# Patient Record
Sex: Female | Born: 1960 | Race: White | Hispanic: No | Marital: Married | Smoking: Current every day smoker
Health system: Southern US, Community
[De-identification: ages and names within clinical notes are randomized; demographics above are authoritative.]

---

## 2006-05-08 ENCOUNTER — Emergency Department (HOSPITAL_COMMUNITY): Admission: EM | Admit: 2006-05-08 | Discharge: 2006-05-08 | Payer: Self-pay | Admitting: Emergency Medicine

## 2017-08-05 ENCOUNTER — Emergency Department (HOSPITAL_COMMUNITY)
Admission: EM | Admit: 2017-08-05 | Discharge: 2017-08-05 | Disposition: A | Discharge status: Home / Self Care | Payer: Self-pay | Attending: Emergency Medicine | Admitting: Emergency Medicine

## 2017-08-05 ENCOUNTER — Encounter (HOSPITAL_COMMUNITY): Payer: Self-pay

## 2017-08-05 ENCOUNTER — Other Ambulatory Visit: Payer: Self-pay

## 2017-08-05 ENCOUNTER — Emergency Department (HOSPITAL_COMMUNITY): Payer: Self-pay

## 2017-08-05 DIAGNOSIS — R0789 Other chest pain: Secondary | ICD-10-CM | POA: Insufficient documentation

## 2017-08-05 DIAGNOSIS — F17210 Nicotine dependence, cigarettes, uncomplicated: Secondary | ICD-10-CM | POA: Insufficient documentation

## 2017-08-05 MED ORDER — TRAMADOL HCL 50 MG PO TABS: 50.0000 mg | ORAL_TABLET | Freq: Two times a day (BID) | ORAL | 0 refills | Status: DC | PRN

## 2017-08-05 MED ORDER — KETOROLAC TROMETHAMINE 60 MG/2ML IM SOLN
30.0000 mg | Freq: Once | INTRAMUSCULAR | Status: AC
  Administered 2017-08-05: 30 mg via INTRAMUSCULAR
  Filled 2017-08-05: qty 2

## 2017-08-05 MED ORDER — OXYCODONE-ACETAMINOPHEN 5-325 MG PO TABS
1.0000 | ORAL_TABLET | Freq: Once | ORAL | Status: AC
  Administered 2017-08-05: 1 via ORAL
  Filled 2017-08-05: qty 1

## 2017-08-05 NOTE — ED Notes (Addendum)
Pt reports that she had tripped over a stepping stone and fell on a rock and now is c/o left sided rib pain, no redness or bruising or deformities are noted. Pt spouse is present.

## 2017-08-05 NOTE — ED Triage Notes (Addendum)
Patient tripped on a rock stepping stone and landed on her left rib cage area. Patient c/o severe pain left lower rib cage . Patient denies LOC or hitting her head.

## 2017-08-05 NOTE — ED Notes (Signed)
Reviewed incentive spiriometer

## 2017-08-05 NOTE — ED Provider Notes (Signed)
[facility]-EMERGENCY DEPT Provider Note   CSN: [id number] Arrival date & time: 08/05/17  1412     History   Chief Complaint Chief Complaint  Patient presents with  . Fall  . rib cage pain    left    HPI [name] is a 56 y.o. female who presents today with chief complaint acute onset, constant left-sided chest pain secondary to fall earlier today.  Patient states that she tripped on a stepping stone at around 9:30 AM and fell forward landing on another stone on her left side.  She denies head injury or loss of consciousness.  Since then she endorses development of constant throbbing left-sided chest pain which does not radiate.  Pain worsens and becomes sharp with deep inspiration and certain movements.  She has some shortness of breath secondary to pain.  She did not try anything prior to arrival.  She denies numbness, tingling, weakness, abdominal pain, nausea, vomiting.  She is a smoker of approximately 5 cigarettes daily.  Denies any medical problems.  The history is provided by the patient.    History reviewed. No pertinent past medical history.  There are no active problems to display for this patient.   History reviewed. No pertinent surgical history.  OB History    No data available       Home Medications    Prior to Admission medications   Medication Sig Start Date End Date Taking? Authorizing Provider  traMADol (ULTRAM) 50 MG tablet Take 1 tablet (50 mg total) by mouth every 12 (twelve) hours as needed for severe pain. 08/05/17   Jeanie SewerFawze, Synda Bagent A, PA-C    Family History Family History  Problem Relation Age of Onset  . Heart failure Mother   . Heart failure Father   . Diabetes Father   . Glaucoma Father     Social History Social History   Tobacco Use  . Smoking status: Current Every Day Smoker    Packs/day: 0.15    Types: Cigarettes  . Smokeless tobacco: Never Used  Substance Use Topics  . Alcohol use: No    Frequency:  Never  . Drug use: No     Allergies   Patient has no known allergies.   Review of Systems Review of Systems  Constitutional: Negative for fever.  Respiratory: Positive for shortness of breath. Negative for cough.   Cardiovascular: Positive for chest pain.  Gastrointestinal: Negative for abdominal pain, nausea and vomiting.  Genitourinary: Negative for flank pain.  Musculoskeletal: Negative for arthralgias.  Neurological: Negative for syncope, weakness and numbness.  All other systems reviewed and are negative.    Physical Exam Updated Vital Signs BP 131/69 (BP Location: Left Arm)   Pulse 86   Temp 97.7 F (36.5 C) (Oral)   Resp 18   Ht 5\' 6"  (1.676 m)   Wt 70.3 kg (155 lb)   SpO2 100%   BMI 25.02 kg/m   Physical Exam  Constitutional: She appears well-developed and well-nourished. No distress.  HENT:  Head: Normocephalic and atraumatic.  Eyes: Conjunctivae and EOM are normal. Pupils are equal, round, and reactive to light. Right eye exhibits no discharge. Left eye exhibits no discharge.  Neck: Normal range of motion. Neck supple. No JVD present. No tracheal deviation present.  No midline spine TTP, no paraspinal muscle tenderness, no deformity, crepitus, or step-off noted   Cardiovascular: Normal rate, regular rhythm and normal heart sounds.  Pulmonary/Chest: Effort normal and breath sounds normal. She exhibits tenderness.  Equal rise and fall of chest, no increased work of breathing, patient unable to take a deep breath secondary to pain.  Focal tenderness to palpation of the left lower anterior chest wall along the anterior axillary line and costal margin.  No ecchymosis, crepitus, or paradoxical wall motion.  Abdominal: She exhibits no distension.  Musculoskeletal: Normal range of motion. She exhibits no edema.  No midline spine TTP, no paraspinal muscle tenderness, no deformity, crepitus, or step-off noted   Neurological: She is alert.  Fluent speech, no facial  droop.  Skin: Skin is warm and dry. No erythema.  Psychiatric: She has a normal mood and affect. Her behavior is normal.  Nursing note and vitals reviewed.    ED Treatments / Results  Labs (all labs ordered are listed, but only abnormal results are displayed) Labs Reviewed - No data to display  EKG  EKG Interpretation None       Radiology Dg Ribs Unilateral W/chest Left  Result Date: 08/05/2017 CLINICAL DATA:  Patient tripped on rock, landed on LEFT rib cage area. Pain. EXAM: LEFT RIBS AND CHEST - 3+ VIEW COMPARISON:  None. FINDINGS: No fracture or other bone lesions are seen involving the ribs. There is no evidence of pneumothorax or pleural effusion. Both lungs are clear, except for streaky densities at LEFT base, likely scarring. Heart size and mediastinal contours are within normal limits. IMPRESSION: Negative for rib fracture. Electronically Signed   By: Elsie StainJohn T Curnes M.D.   On: 08/05/2017 15:31    Procedures Procedures (including critical care time)  Medications Ordered in ED Medications  ketorolac (TORADOL) injection 30 mg (30 mg Intramuscular Given 08/05/17 1541)  oxyCODONE-acetaminophen (PERCOCET/ROXICET) 5-325 MG per tablet 1 tablet (1 tablet Oral Given 08/05/17 1541)     Initial Impression / Assessment and Plan / ED Course  I have reviewed the triage vital signs and the nursing notes.  Pertinent labs & imaging results that were available during my care of the patient were reviewed by me and considered in my medical decision making (see chart for details).     Patient presents with left-sided chest wall pain secondary to fall earlier today.  Pain reproducible on palpation.  Afebrile, vital signs are stable.  Low suspicion of ACS or MI in the presence of trauma.  No evidence of flail chest.  Radiographs of the chest reviewed by me show no evidence of fracture or acute cardiopulmonary abnormality.  No evidence of pneumothorax, hemothorax, or cardiac tamponade.  Pain  managed while in the ED.  Given significant discomfort with inspiration in a patient who is a smoker, will discharge with incentive spirometer to avoid the development of a pneumonia.  She will follow-up with her husband's primary care physician for reevaluation of her symptoms.  Discussed indications for return to the ED.  Will discharge with a small amount of tramadol for severe pain.  [place] substance registry shows she last received tramadol July 2017 and has not received any narcotic medication since then.  Patient and patient's husband verbalized understanding of and patient stable for discharge home at this time.  Final Clinical Impressions(s) / ED Diagnoses   Final diagnoses:  Chest wall pain    ED Discharge Orders        Ordered    traMADol (ULTRAM) 50 MG tablet  Every 12 hours PRN     08/05/17 1603       Jeanie SewerFawze, Lino Wickliff A, PA-C 08/05/17 1757    Nira Connardama, Pedro Eduardo, MD 08/06/17  2001  

## 2017-08-05 NOTE — Discharge Instructions (Signed)
Alternate 600 mg of ibuprofen and [phone] mg of Tylenol every 3 hours as needed for pain. Do not exceed 4000 mg of Tylenol daily.  Take tramadol for severe pain but do not drive, drink alcohol, or operate heavy machinery while on this medication.  Apply ice or heat for comfort to the affected area.  Use the incentive spirometer 2 or 3 times daily to avoid development of a pneumonia.  Follow-up with a primary care physician within 1 week for reevaluation of your symptoms.  Return to the ED immediately for any concerning signs or symptoms develop such as persisting shortness of breath, fevers, or productive cough.

## 2018-10-11 IMAGING — CR DG RIBS W/ CHEST 3+V*L*
4 series · 4 of 4 positions shown · non-contrast
Comparison: None.

CLINICAL DATA: Patient tripped on rock, landed on LEFT rib cage
area. Pain.

EXAM:
LEFT RIBS AND CHEST - 3+ VIEW

[w chest pa]
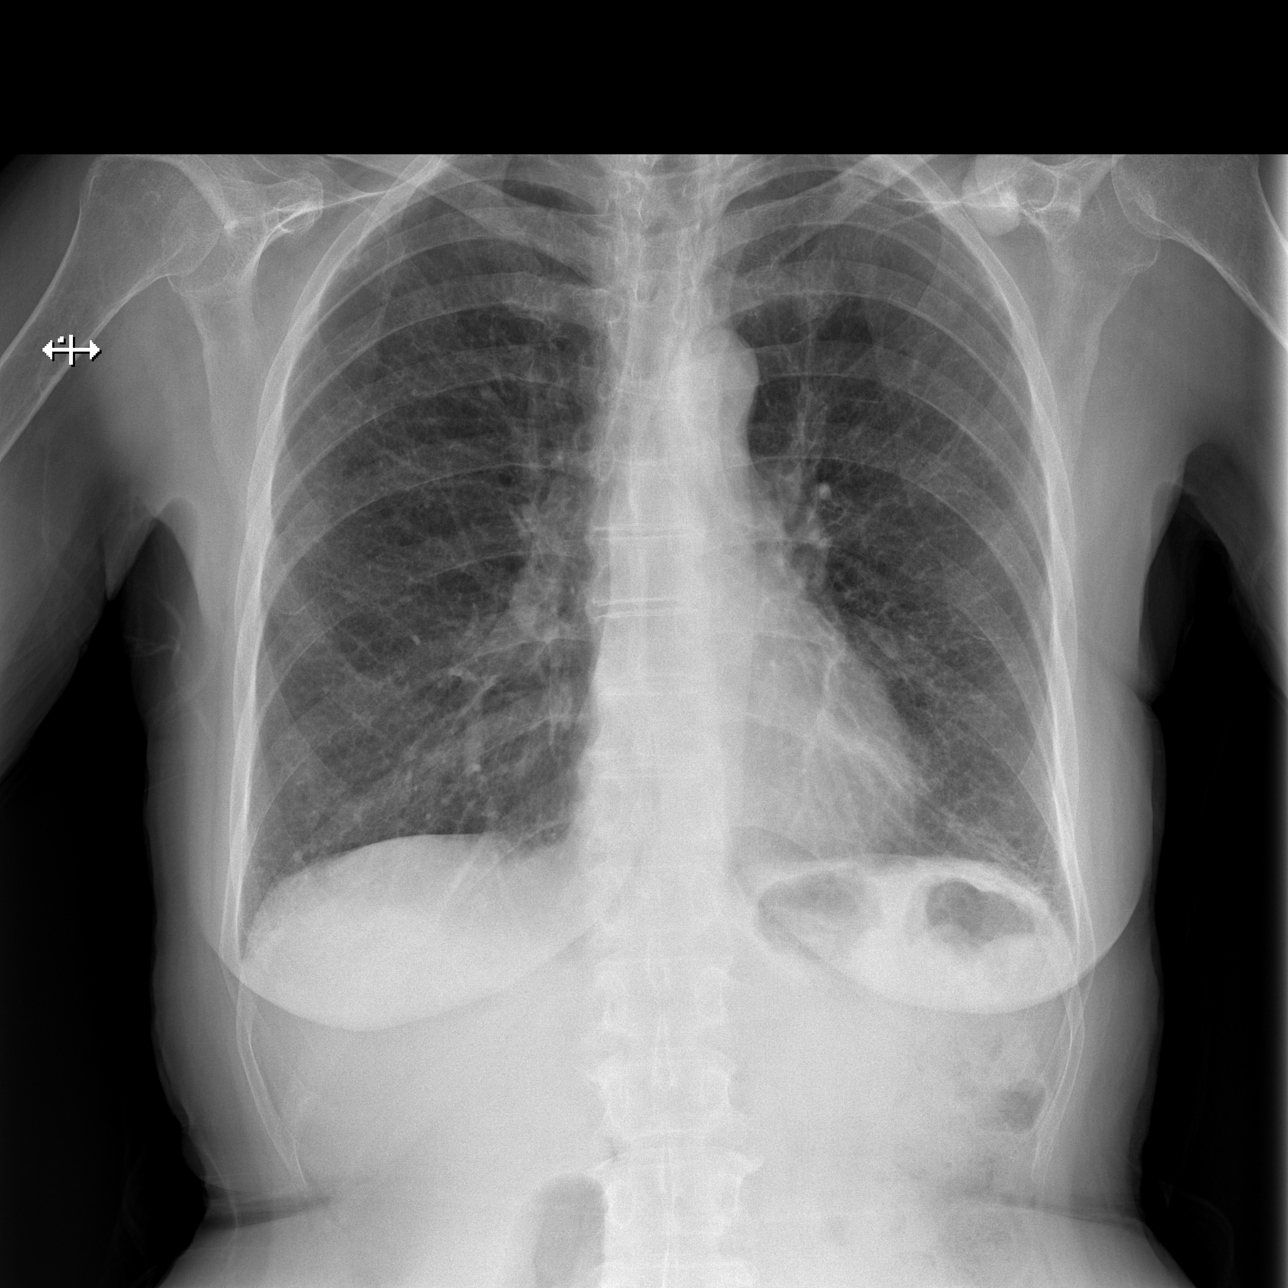

[w ribs ap upper left]
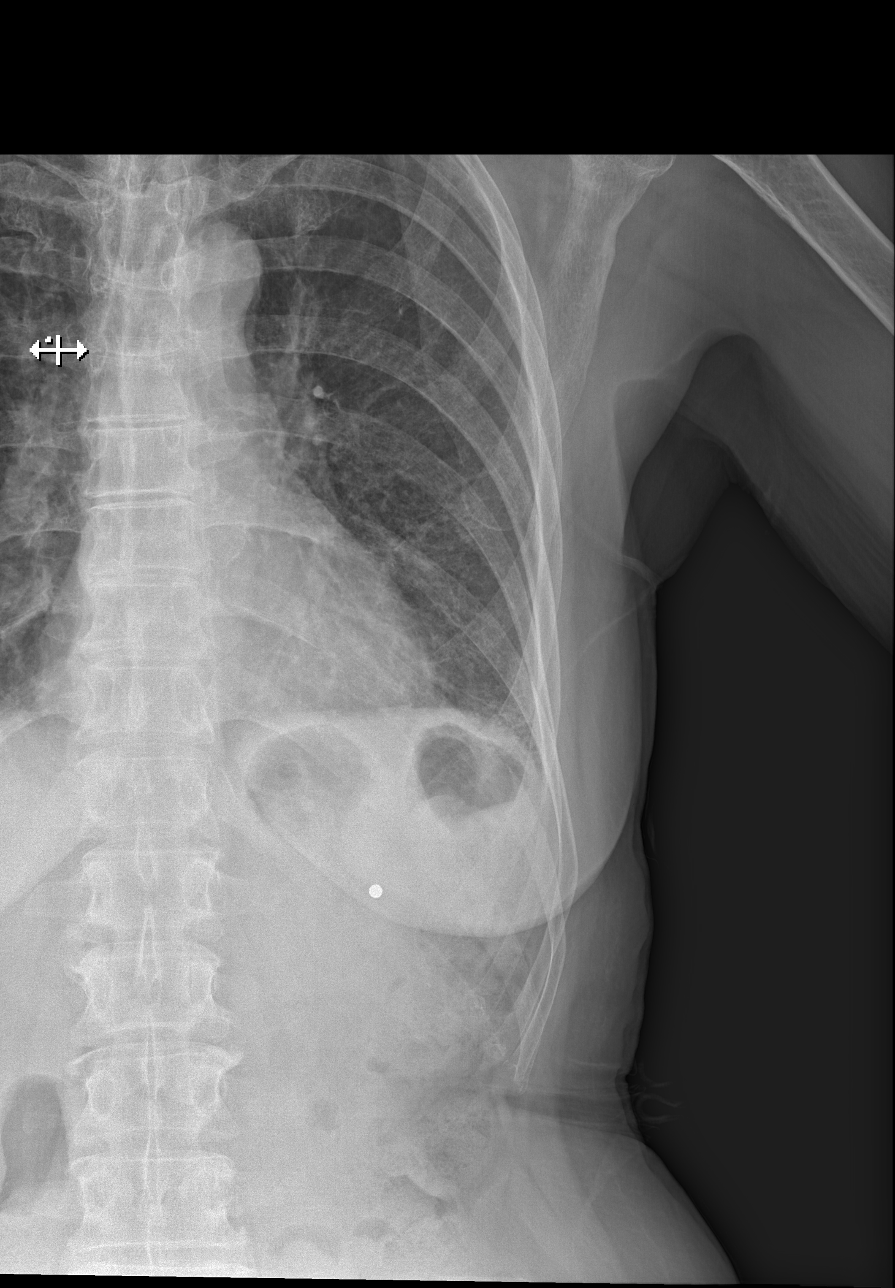

[w ribs obl left (1 of 2)]
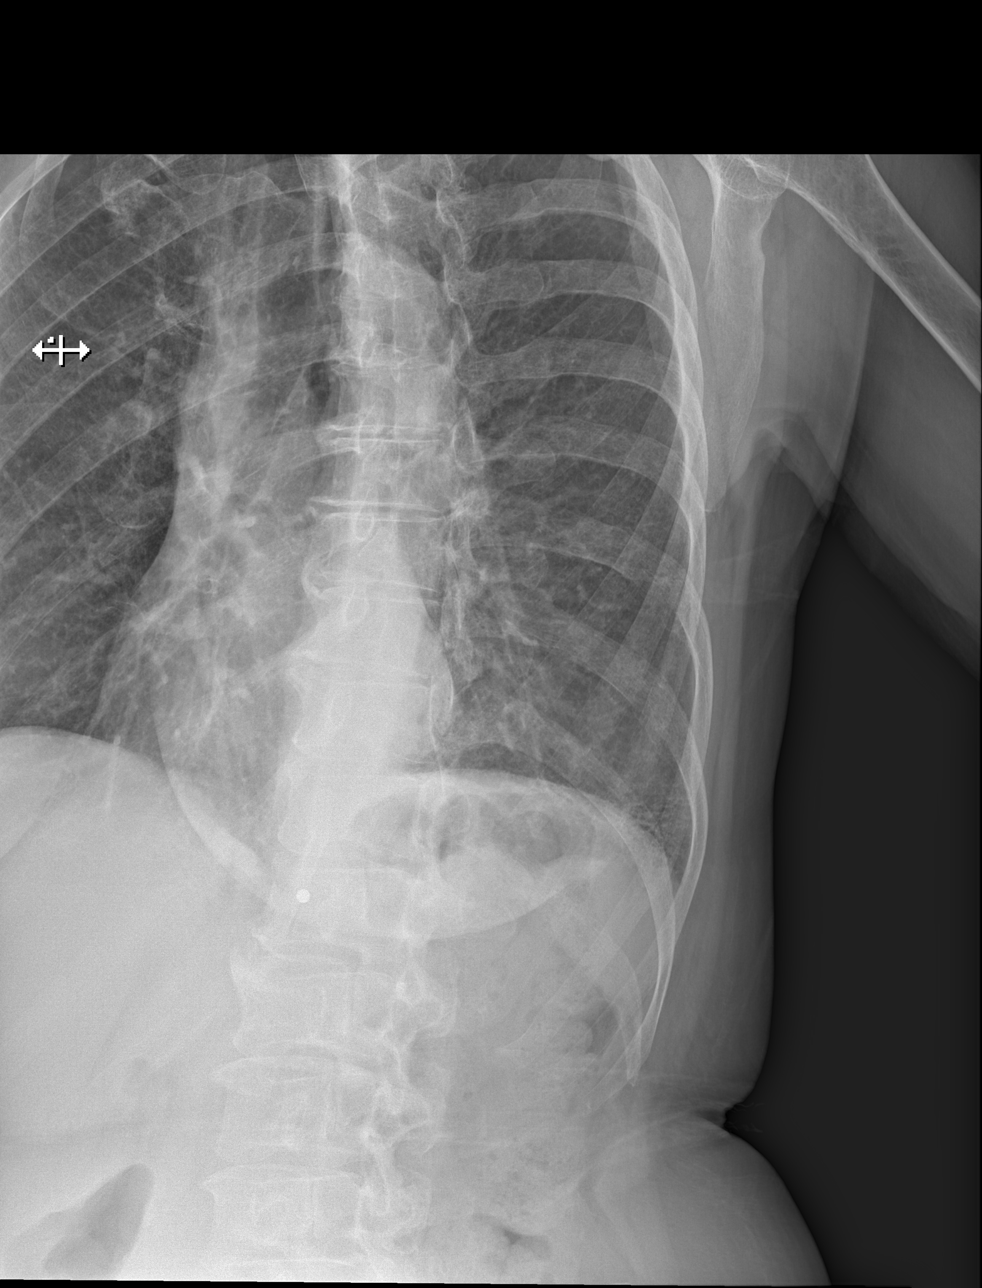

[w ribs obl left (2 of 2)]
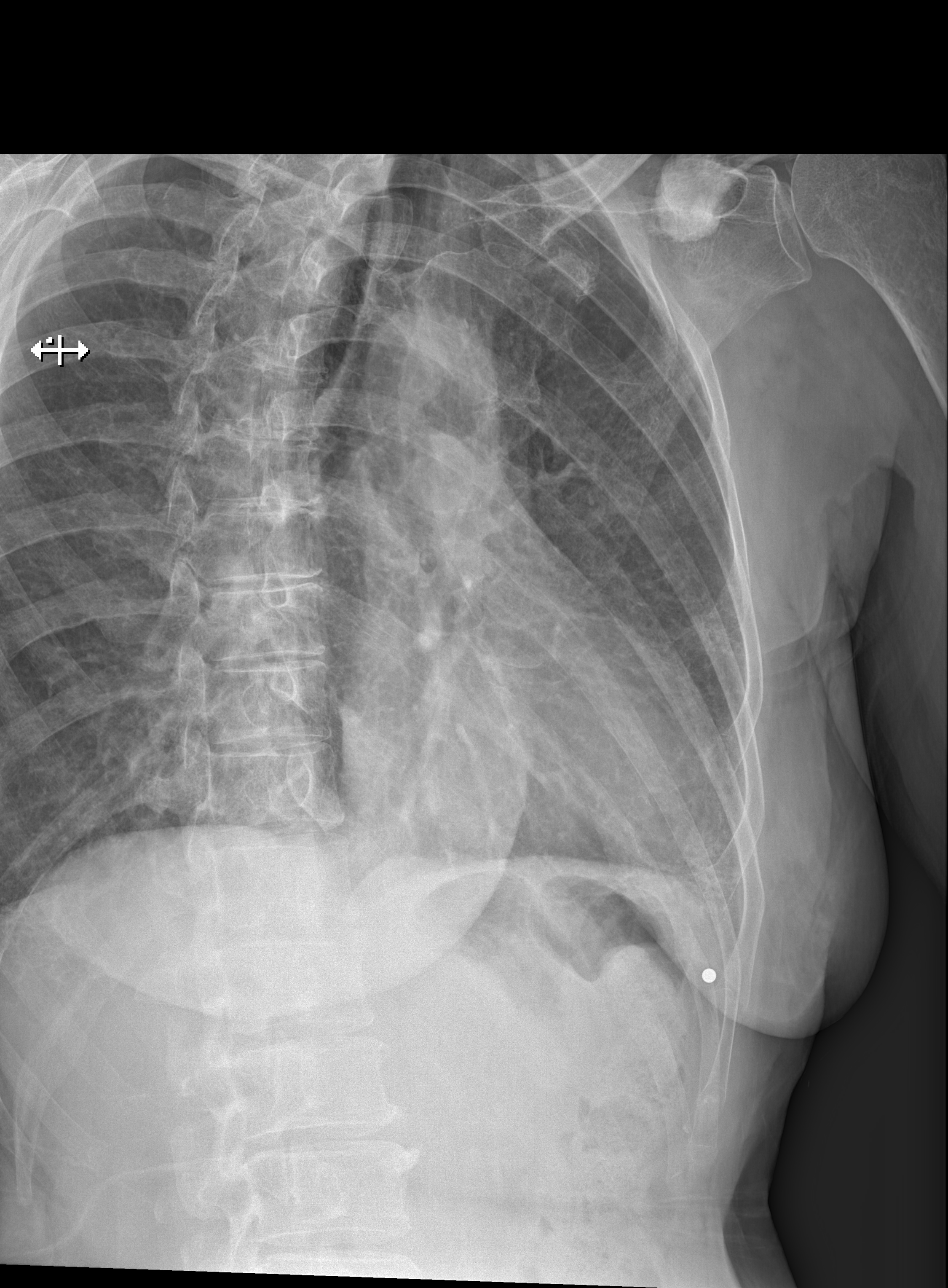

[4 of 4 positions shown; findings below may reference images not displayed]

FINDINGS: No fracture or other bone lesions are seen involving the ribs. There
is no evidence of pneumothorax or pleural effusion. Both lungs are
clear, except for streaky densities at LEFT base, likely scarring.
Heart size and mediastinal contours are within normal limits.
IMPRESSION: Negative for rib fracture.

## 2021-10-08 ENCOUNTER — Ambulatory Visit (HOSPITAL_COMMUNITY)
Admission: EM | Admit: 2021-10-08 | Discharge: 2021-10-08 | Disposition: A | Discharge status: Home / Self Care | Payer: Self-pay | Attending: Emergency Medicine | Admitting: Emergency Medicine

## 2021-10-08 ENCOUNTER — Encounter (HOSPITAL_COMMUNITY): Payer: Self-pay | Admitting: Emergency Medicine

## 2021-10-08 ENCOUNTER — Other Ambulatory Visit: Payer: Self-pay

## 2021-10-08 DIAGNOSIS — W540XXA Bitten by dog, initial encounter: Secondary | ICD-10-CM

## 2021-10-08 DIAGNOSIS — S61210A Laceration without foreign body of right index finger without damage to nail, initial encounter: Secondary | ICD-10-CM

## 2021-10-08 MED ORDER — AMOXICILLIN-POT CLAVULANATE 875-125 MG PO TABS: 1.0000 | ORAL_TABLET | Freq: Two times a day (BID) | ORAL | 0 refills | Status: AC

## 2021-10-08 NOTE — ED Provider Notes (Addendum)
Casey Rice    CSN: [id number] Arrival date & time: 10/08/21  1944    HISTORY   Chief Complaint  Patient presents with   Finger Injury   HPI Casey Rice is a 61 y.o. female. Patient presents to urgent care 45 minutes after being bitten by her dog.  Patient states she was attempting to take away a tray of food that the dog was licking clean and the dog bit her.  Patient states it was her dog, dog is up-to-date on her rabies vaccine, patient states her last tetanus vaccine was 2 to 3 years ago.  Patient states she did not do anything to treat the wound prior to arrival.   History reviewed. No pertinent past medical history. There are no problems to display for this patient.  History reviewed. No pertinent surgical history. OB History   No obstetric history on file.    Home Medications    Prior to Admission medications   Medication Sig Start Date End Date Taking? Authorizing Provider  traMADol (ULTRAM) 50 MG tablet Take 1 tablet (50 mg total) by mouth every 12 (twelve) hours as needed for severe pain. 08/05/17   Renita Papa, PA-C    Family History Family History  Problem Relation Age of Onset   Heart failure Mother    Heart failure Father    Diabetes Father    Glaucoma Father    Social History Social History   Tobacco Use   Smoking status: Every Day    Packs/day: 0.15    Types: Cigarettes   Smokeless tobacco: Never  Vaping Use   Vaping Use: Never used  Substance Use Topics   Alcohol use: No   Drug use: No   Allergies   Patient has no known allergies.  Review of Systems Review of Systems Pertinent findings noted in history of present illness.   Physical Exam Triage Vital Signs ED Triage Vitals  Enc Vitals Group     BP 07/03/21 0827 (!) 147/82     Pulse Rate 07/03/21 0827 72     Resp 07/03/21 0827 18     Temp 07/03/21 0827 98.3 F (36.8 C)     Temp Source 07/03/21 0827 Oral     SpO2 07/03/21 0827 98 %     Weight --      Height --       Head Circumference --      Peak Flow --      Pain Score 07/03/21 0826 5     Pain Loc --      Pain Edu? --      Excl. in [place]? --   No data found.  Updated Vital Signs BP (!) 171/73    Pulse 70    Temp 98.8 F (37.1 C) (Oral)    Resp 16    SpO2 98%   Physical Exam Vitals and nursing note reviewed.  Constitutional:      General: She is not in acute distress.    Appearance: Normal appearance. She is not ill-appearing.  HENT:     Head: Normocephalic and atraumatic.  Eyes:     General: Lids are normal.        Right eye: No discharge.        Left eye: No discharge.     Extraocular Movements: Extraocular movements intact.     Conjunctiva/sclera: Conjunctivae normal.     Right eye: Right conjunctiva is not injected.     Left eye: Left conjunctiva is  not injected.  Neck:     Trachea: Trachea and phonation normal.  Cardiovascular:     Rate and Rhythm: Normal rate and regular rhythm.     Pulses: Normal pulses.     Heart sounds: Normal heart sounds. No murmur heard.   No friction rub. No gallop.  Pulmonary:     Effort: Pulmonary effort is normal. No accessory muscle usage, prolonged expiration or respiratory distress.     Breath sounds: Normal breath sounds. No stridor, decreased air movement or transmitted upper airway sounds. No decreased breath sounds, wheezing, rhonchi or rales.  Chest:     Chest wall: No tenderness.  Musculoskeletal:        General: Normal range of motion.     Cervical back: Normal range of motion and neck supple. Normal range of motion.  Lymphadenopathy:     Cervical: No cervical adenopathy.  Skin:    General: Skin is warm and dry.     Findings: Lesion (Laceration) present. No erythema or rash.  Neurological:     General: No focal deficit present.     Mental Status: She is alert and oriented to person, place, and time.  Psychiatric:        Mood and Affect: Mood normal.        Behavior: Behavior normal.    Visual Acuity Right Eye Distance:   Left  Eye Distance:   Bilateral Distance:    Right Eye Near:   Left Eye Near:    Bilateral Near:     UC Couse / Diagnostics / Procedures:    EKG  Radiology No results found.  Procedures Laceration Repair  Date/Time: 10/08/2021 8:40 PM Performed by: Lynden Oxford Scales, PA-C Authorized by: Lynden Oxford Scales, PA-C   Consent:    Consent obtained:  Verbal   Consent given by:  Patient   Risks, benefits, and alternatives were discussed: yes     Risks discussed:  Infection, pain, poor cosmetic result, need for additional repair and poor wound healing   Alternatives discussed:  No treatment, delayed treatment, observation and referral Universal protocol:    Procedure explained and questions answered to patient or proxy's satisfaction: yes     Patient identity confirmed:  Verbally with patient and arm band Anesthesia:    Anesthesia method:  Local infiltration   Local anesthetic:  Lidocaine 1% WITH epi Laceration details:    Location:  Finger   Finger location:  R index finger   Length (cm):  1.5   Depth (mm):  2 Pre-procedure details:    Preparation:  Patient was prepped and draped in usual sterile fashion Exploration:    Limited defect created (wound extended): no     Hemostasis achieved with:  Epinephrine   Imaging outcome: foreign body not noted     Wound exploration: wound explored through full range of motion and entire depth of wound visualized     Wound extent: no fascia violation noted, no foreign bodies/material noted, no muscle damage noted, no nerve damage noted and no tendon damage noted     Contaminated: no   Treatment:    Area cleansed with:  Povidone-iodine   Amount of cleaning:  Standard   Irrigation method:  Syringe   Debridement:  None   Undermining:  None   Scar revision: no     Layers/structures repaired:  Deep subcutaneous Skin repair:    Repair method:  Sutures   Suture size:  3-0   Suture material:  Prolene   Suture  technique:  Simple  interrupted   Number of sutures:  2 Approximation:    Approximation:  Close Repair type:    Repair type:  Simple Post-procedure details:    Dressing:  Non-adherent dressing   Procedure completion:  Tolerated (including critical care time)  UC Diagnoses / Final Clinical Impressions(s)   I have reviewed the triage vital signs and the nursing notes.  Pertinent labs & imaging results that were available during my care of the patient were reviewed by me and considered in my medical decision making (see chart for details).    Final diagnoses:  Dog bite, initial encounter  Laceration of right index finger, foreign body presence unspecified, nail damage status unspecified, initial encounter   Patient advised to keep wound covered for 24 hours then changed dressing every 24 hours thereafter until not draining.  Patient provided with prescription for Augmentin, patient vies to take if she suspects wound is becoming infected, patient also advised that she does not need to take it at this time given her speedy presentation and minimal manipulation of the wound prior to arrival.  Patient vies to return in 10 days to have sutures removed.  Return precautions advised.  ED Prescriptions     Medication Sig Dispense Auth. Provider   amoxicillin-clavulanate (AUGMENTIN) 875-125 MG tablet Take 1 tablet by mouth 2 (two) times daily for 7 days. 14 tablet Lynden Oxford Scales, PA-C      PDMP not reviewed this encounter.  Pending results:  Labs Reviewed - No data to display  Medications Ordered in UC: Medications - No data to display  Disposition Upon Discharge:  Condition: stable for discharge home Home: take medications as prescribed; routine discharge instructions as discussed; follow up as advised.  Patient presented with an acute illness with associated systemic symptoms and significant discomfort requiring urgent management. In my opinion, this is a condition that a prudent lay person (someone  who possesses an average knowledge of health and medicine) may potentially expect to result in complications if not addressed urgently such as respiratory distress, impairment of bodily function or dysfunction of bodily organs.   Routine symptom specific, illness specific and/or disease specific instructions were discussed with the patient and/or caregiver at length.   As such, the patient has been evaluated and assessed, work-up was performed and treatment was provided in alignment with urgent care protocols and evidence based medicine.  Patient/parent/caregiver has been advised that the patient may require follow up for further testing and treatment if the symptoms continue in spite of treatment, as clinically indicated and appropriate.  If the patient was tested for COVID-19, Influenza and/or RSV, then the patient/parent/guardian was advised to isolate at home pending the results of his/her diagnostic coronavirus test and potentially longer if theyre positive. I have also advised pt that if his/her COVID-19 test returns positive, it's recommended to self-isolate for at least 10 days after symptoms first appeared AND until fever-free for 24 hours without fever reducer AND other symptoms have improved or resolved. Discussed self-isolation recommendations as well as instructions for household member/close contacts as per the [facility] and [facility], and also gave patient the [place] packet with this information.  Patient/parent/caregiver has been advised to return to the [facility] or PCP in 3-5 days if no better; to PCP or the Emergency Department if new signs and symptoms develop, or if the current signs or symptoms continue to change or worsen for further workup, evaluation and treatment as clinically indicated and appropriate  The patient will  follow up with their current PCP if and as advised. If the patient does not currently have a PCP we will assist them in obtaining one.   The patient may need specialty follow  up if the symptoms continue, in spite of conservative treatment and management, for further workup, evaluation, consultation and treatment as clinically indicated and appropriate.   Patient/parent/caregiver verbalized understanding and agreement of plan as discussed.  All questions were addressed during visit.  Please see discharge instructions below for further details of plan.  Discharge Instructions:   Discharge Instructions      Please keep your wound covered for the next 24 hours.  Please perform dressing change every 24 hours by covering with the nonstick pad and wrapping with Coban tape as provided.  Once you see there is no more blood or drainage on the wound, you can leave it uncovered.  Please keep your hand dry, do not immerse it in water.  If you need to wash your hands please wash around your wound gently with a cloth.  I provided you with a prescription for amoxicillin clavulanic acid also noticed Augmentin.  This is an antibiotic to treat infections that occur in an animal bite wounds.  You do not need to begin this medication at this time however, if you notice that you have any drainage from the wound, increased swelling, increased tenderness or significantly increasing redness around the wound or you are concerned, please do pick up the prescription and begin taking 1 tablet twice daily for 7 days.  The stitches need to stay in place for the next 10 days.  Please come back on February 12 to have them removed.  I will be happy to do this for you.  Thank you for visiting urgent care today.  It was a privilege to participate in your care.      This office note has been dictated using Museum/gallery curator.  Unfortunately, and despite my best efforts, this method of dictation can sometimes lead to occasional typographical or grammatical errors.  I apologize in advance if this occurs.     Lynden Oxford Scales, PA-C 10/08/21 2044    Lynden Oxford Scales,  PA-C 10/08/21 2046

## 2021-10-08 NOTE — Discharge Instructions (Signed)
Please keep your wound covered for the next 24 hours.  Please perform dressing change every 24 hours by covering with the nonstick pad and wrapping with Coban tape as provided.  Once you see there is no more blood or drainage on the wound, you can leave it uncovered.  Please keep your hand dry, do not immerse it in water.  If you need to wash your hands please wash around your wound gently with a cloth.  I provided you with a prescription for amoxicillin clavulanic acid also noticed Augmentin.  This is an antibiotic to treat infections that occur in an animal bite wounds.  You do not need to begin this medication at this time however, if you notice that you have any drainage from the wound, increased swelling, increased tenderness or significantly increasing redness around the wound or you are concerned, please do pick up the prescription and begin taking 1 tablet twice daily for 7 days.  The stitches need to stay in place for the next 10 days.  Please come back on February 12 to have them removed.  I will be happy to do this for you.  Thank you for visiting urgent care today.  It was a privilege to participate in your care.

## 2021-10-08 NOTE — ED Triage Notes (Signed)
PT took something away from her dog and dog's tooth lacerated finger. Dog is UTD on rabies vaccines. Right index finger affected

## 2021-10-19 ENCOUNTER — Other Ambulatory Visit: Payer: Self-pay

## 2021-10-19 ENCOUNTER — Encounter (HOSPITAL_COMMUNITY): Payer: Self-pay | Admitting: Emergency Medicine

## 2021-10-19 ENCOUNTER — Ambulatory Visit (HOSPITAL_COMMUNITY)
Admission: EM | Admit: 2021-10-19 | Discharge: 2021-10-19 | Disposition: A | Discharge status: Home / Self Care | Payer: Self-pay

## 2021-10-19 NOTE — ED Triage Notes (Signed)
Pt here to get a stitch out of right right index finger. Reports one stitch came out last night. Denies any problems with finger.

## 2022-08-01 ENCOUNTER — Telehealth: Payer: Self-pay | Admitting: Nurse Practitioner

## 2022-08-01 DIAGNOSIS — J40 Bronchitis, not specified as acute or chronic: Secondary | ICD-10-CM

## 2022-08-01 MED ORDER — FLUTICASONE PROPIONATE 50 MCG/ACT NA SUSP: 2.0000 | Freq: Every day | NASAL | 0 refills | Status: DC

## 2022-08-01 MED ORDER — AZITHROMYCIN 250 MG PO TABS: ORAL_TABLET | ORAL | 0 refills | Status: AC

## 2022-08-01 MED ORDER — PSEUDOEPH-BROMPHEN-DM 30-2-10 MG/5ML PO SYRP: 5.0000 mL | ORAL_SOLUTION | Freq: Four times a day (QID) | ORAL | 0 refills | Status: DC | PRN

## 2022-08-01 NOTE — Progress Notes (Signed)
I have spent 5 minutes in review of e-visit questionnaire, review and updating patient chart, medical decision making and response to patient.  ° °Naya Ilagan W Crimson Beer, NP ° °  °

## 2022-08-01 NOTE — Progress Notes (Signed)
We are sorry that you are not feeling well.  Here is how we plan to help!  Based on your presentation I believe you most likely have A cough due to bacteria.  When patients have a fever and a productive cough with a change in color or increased sputum production, we are concerned about bacterial bronchitis.  If left untreated it can progress to pneumonia.  If your symptoms do not improve with your treatment plan it is important that you contact your provider.   I have prescribed Azithromyin 250 mg: two tablets now and then one tablet daily for 4 additonal days    In addition you may use a steroid nasal spray that has been sent to the pharmacy as well as a cough syrup   From your responses in the eVisit questionnaire you describe inflammation in the upper respiratory tract which is causing a significant cough.  This is commonly called Bronchitis and has four common causes:   Allergies Viral Infections Acid Reflux Bacterial Infection Allergies, viruses and acid reflux are treated by controlling symptoms or eliminating the cause. An example might be a cough caused by taking certain blood pressure medications. You stop the cough by changing the medication. Another example might be a cough caused by acid reflux. Controlling the reflux helps control the cough.  USE OF BRONCHODILATOR ("RESCUE") INHALERS: There is a risk from using your bronchodilator too frequently.  The risk is that over-reliance on a medication which only relaxes the muscles surrounding the breathing tubes can reduce the effectiveness of medications prescribed to reduce swelling and congestion of the tubes themselves.  Although you feel brief relief from the bronchodilator inhaler, your asthma may actually be worsening with the tubes becoming more swollen and filled with mucus.  This can delay other crucial treatments, such as oral steroid medications. If you need to use a bronchodilator inhaler daily, several times per day, you should  discuss this with your provider.  There are probably better treatments that could be used to keep your asthma under control.     HOME CARE Only take medications as instructed by your medical team. Complete the entire course of an antibiotic. Drink plenty of fluids and get plenty of rest. Avoid close contacts especially the very young and the elderly Cover your mouth if you cough or cough into your sleeve. Always remember to wash your hands A steam or ultrasonic humidifier can help congestion.   GET HELP RIGHT AWAY IF: You develop worsening fever. You become short of breath You cough up blood. Your symptoms persist after you have completed your treatment plan MAKE SURE YOU  Understand these instructions. Will watch your condition. Will get help right away if you are not doing well or get worse.    Thank you for choosing an e-visit.  Your e-visit answers were reviewed by a board certified advanced clinical practitioner to complete your personal care plan. Depending upon the condition, your plan could have included both over the counter or prescription medications.  Please review your pharmacy choice. Make sure the pharmacy is open so you can pick up prescription now. If there is a problem, you may contact your provider through [employer] and have the prescription routed to another pharmacy.  Your safety is important to [place]. If you have drug allergies check your prescription carefully.   For the next 24 hours you can use MyChart to ask questions about today's visit, request a non-urgent call back, or ask for a work or  school excuse. You will get an email in the next two days asking about your experience. I hope that your e-visit has been valuable and will speed your recovery.

## 2022-08-28 ENCOUNTER — Other Ambulatory Visit: Payer: Self-pay | Admitting: Nurse Practitioner

## 2022-08-28 DIAGNOSIS — J40 Bronchitis, not specified as acute or chronic: Secondary | ICD-10-CM

## 2022-12-01 ENCOUNTER — Other Ambulatory Visit: Payer: Self-pay | Admitting: Sports Medicine

## 2022-12-01 MED ORDER — TRAMADOL HCL 50 MG PO TABS: 50.0000 mg | ORAL_TABLET | Freq: Four times a day (QID) | ORAL | 0 refills | Status: AC | PRN

## 2022-12-08 ENCOUNTER — Other Ambulatory Visit: Payer: Self-pay | Admitting: Sports Medicine

## 2023-11-01 ENCOUNTER — Ambulatory Visit (HOSPITAL_COMMUNITY)
Admission: EM | Admit: 2023-11-01 | Discharge: 2023-11-01 | Disposition: A | Discharge status: Home / Self Care | Payer: Medicare Other

## 2023-11-01 ENCOUNTER — Ambulatory Visit: Payer: Self-pay | Admitting: General Practice

## 2023-11-01 ENCOUNTER — Encounter (HOSPITAL_COMMUNITY): Payer: Self-pay | Admitting: *Deleted

## 2023-11-01 DIAGNOSIS — H6121 Impacted cerumen, right ear: Secondary | ICD-10-CM

## 2023-11-01 DIAGNOSIS — L03031 Cellulitis of right toe: Secondary | ICD-10-CM

## 2023-11-01 MED ORDER — SULFAMETHOXAZOLE-TRIMETHOPRIM 800-160 MG PO TABS: 1.0000 | ORAL_TABLET | Freq: Two times a day (BID) | ORAL | 0 refills | Status: AC

## 2023-11-01 NOTE — ED Triage Notes (Signed)
 Pt states she has right foot swelling and her middle toe hurts X 1 week. She has been soaking it in Epson salt and using a several creams without relief.    She states she has bilateral ear fullness X years.

## 2023-11-01 NOTE — Telephone Encounter (Signed)
 Copied from CRM [id number]. Topic: Clinical - Red Word Triage >> Nov 01, 2023  3:34 PM Gildardo Pounds wrote: Red Word that prompted transfer to Nurse Triage: toe on right foot is red and swollen cannot put a shoe on it for a week now and needs ear checked. [phone]   Chief Complaint: Toe swelling Symptoms: toe pain, swelling, and redness Frequency: Ongoing for 1 week Pertinent Negatives: Patient denies injury Disposition: [] ED /[x] Urgent Care (no appt availability in office) / [] Appointment(In office/virtual)/ []  [facility]/ [] Home Care/ [] Refused Recommended Disposition /[] [facility]/ []  Follow-up with PCP Additional Notes:  Patient stated she has been having middle toe pain and swelling for 1 week on the right foot. There is also redness. The pain comes and goes. She has been soaking her foot in epsom salt. Patient also stated she has ear wax buildup/difficulty hearing and wants to get her ear checked. Patient is not currently established. Next available new patient appointment is not until March. This RN recommended for patient to go to urgent care to address the acute concern and I also offered to schedule New Patient appt to establish care. Patient stated she will try to go to urgent care. She declined to schedule New Patient appt because there was no availability at the office location that she wanted. She will continue to call around.   Reason for Disposition  [1] MODERATE pain (e.g., interferes with normal activities, limping) AND [2] present > 3 days  Answer Assessment - Initial Assessment Questions 1. ONSET: "When did the pain start?"      A week ago  2. LOCATION: "Where is the pain located?"   (e.g., around nail, entire toe, at foot joint)      Middle toe right foot   3. PAIN: "How bad is the pain?"    (Scale 1-10; or mild, moderate, severe)   -  MILD (1-3): doesn't interfere with normal activities    -  MODERATE (4-7): interferes with normal activities (e.g.,  work or school) or awakens from sleep, limping    -  SEVERE (8-10): excruciating pain, unable to do any normal activities, unable to walk     No pain right now. When it hurts, it is a 7/10  4. APPEARANCE: "What does the toe look like?" (e.g., redness, swelling, bruising, pallor)     Redness, swelling (moderate)  5. CAUSE: "What do you think is causing the toe pain?"     Unknown  6. OTHER SYMPTOMS: "Do you have any other symptoms?" (e.g., leg pain, rash, fever, numbness)     No  Answer Assessment - Initial Assessment Questions 1. LOCATION: "Which ear is involved?"      R ear  2. SYMPTOMS: "What are the main symptoms?" (e.g., fullness, decreased hearing, itching, discomfort)     Decreased hearing, itchiness  3. ONSET: "When did the ear wax buildup and difficulty hearing start?"     Months ago  4. PAIN: "Is there any earache?" "How bad is it?"  (Scale 1-10; or mild, moderate, severe)     No pain right now  5. OBJECTS: "Do you use cotton swabs (Q-tips) in your ear?" "Have you put anything else in your ear?"     No  Protocols used: Toe Pain-A-AH, Earwax-A-AH

## 2023-11-01 NOTE — Discharge Instructions (Addendum)
 Start taking Bactrim twice daily for 7 days.  You can apply warm compresses to the toe to help with swelling and pain.  Otherwise you can take Tylenol as needed for pain.  If your symptoms persist or worsen return here for reevaluation.  We have cleaned impacted earwax out of your right ear.  If you have persistent difficulty with hearing I would recommend following up with a primary care provider and they can provide a referral to have your hearing tested.

## 2023-11-01 NOTE — ED Provider Notes (Signed)
 [facility]    CSN: [id number] Arrival date & time: 11/01/23  1654      History   Chief Complaint Chief Complaint  Patient presents with   Foot Pain   Ear Fullness    HPI Casey Rice is a 63 y.o. female.   Patient presents with right third toe swelling and pain x 1 week.  Patient reports soaking in Epsom salt and using several over-the-counter creams without relief.  Denies any known injury.  Patient also endorses bilateral ear fullness and difficulty with hearing for years.   Foot Pain  Ear Fullness    History reviewed. No pertinent past medical history.  There are no active problems to display for this patient.   History reviewed. No pertinent surgical history.  OB History   No obstetric history on file.      Home Medications    Prior to Admission medications   Medication Sig Start Date End Date Taking? Authorizing Provider  gabapentin (NEURONTIN) 100 MG capsule Take by mouth. 11/14/19   [provider]  sulfamethoxazole-trimethoprim (BACTRIM DS) 800-160 MG tablet Take 1 tablet by mouth 2 (two) times daily for 7 days. 11/01/23 11/08/23 Yes Susann Givens, Shizue Kaseman A, NP  valACYclovir (VALTREX) 1000 MG tablet Take by mouth. 11/14/19   [provider]  brompheniramine-pseudoephedrine-DM 30-2-10 MG/5ML syrup Take 5 mLs by mouth 4 (four) times daily as needed. 08/01/22   Claiborne Rigg, NP  fluticasone (FLONASE) 50 MCG/ACT nasal spray Place 2 sprays into both nostrils daily. 08/01/22   Claiborne Rigg, NP    Family History Family History  Problem Relation Age of Onset   Heart failure Mother    Heart failure Father    Diabetes Father    Glaucoma Father     Social History Social History   Tobacco Use   Smoking status: Every Day    Current packs/day: 0.15    Types: Cigarettes   Smokeless tobacco: Never  Vaping Use   Vaping status: Never Used  Substance Use Topics   Alcohol use: No   Drug use: No     Allergies   Augmentin  [amoxicillin-pot clavulanate] and Z-pak [azithromycin]   Review of Systems Review of Systems  Per HPI  Physical Exam Triage Vital Signs ED Triage Vitals  Encounter Vitals Group     BP 11/01/23 1912 (!) 148/72     Systolic BP Percentile --      Diastolic BP Percentile --      Pulse Rate 11/01/23 1912 69     Resp 11/01/23 1912 18     Temp 11/01/23 1912 98.2 F (36.8 C)     Temp Source 11/01/23 1912 Oral     SpO2 11/01/23 1912 95 %     Weight --      Height --      Head Circumference --      Peak Flow --      Pain Score 11/01/23 1910 1     Pain Loc --      Pain Education --      Exclude from Growth Chart --    No data found.  Updated Vital Signs BP (!) 148/72 (BP Location: Left Arm)   Pulse 69   Temp 98.2 F (36.8 C) (Oral)   Resp 18   SpO2 95%   Visual Acuity Right Eye Distance:   Left Eye Distance:   Bilateral Distance:    Right Eye Near:   Left Eye Near:  Bilateral Near:     Physical Exam Vitals and nursing note reviewed.  Constitutional:      General: She is awake. She is not in acute distress.    Appearance: Normal appearance. She is well-developed and well-groomed. She is not ill-appearing.  HENT:     Right Ear: There is impacted cerumen.     Left Ear: Tympanic membrane, ear canal and external ear normal.  Feet:     Right foot:     Skin integrity: Erythema and warmth present.     Comments: Erythema, swelling, and warmth noted to third right toe consistent with paronychia. Skin:    General: Skin is warm and dry.  Neurological:     Mental Status: She is alert.  Psychiatric:        Behavior: Behavior is cooperative.      UC Treatments / Results  Labs (all labs ordered are listed, but only abnormal results are displayed) Labs Reviewed - No data to display  EKG   Radiology No results found.  Procedures Procedures (including critical care time)  Medications Ordered in UC Medications - No data to display  Initial Impression /  Assessment and Plan / UC Course  I have reviewed the triage vital signs and the nursing notes.  Pertinent labs & imaging results that were available during my care of the patient were reviewed by me and considered in my medical decision making (see chart for details).     Patient presented with right third toe swelling and pain x 1 week.  Upon assessment erythema, swelling, and warmth noted to the third right toe just below toenail consistent with paronychia.  Prescribed Bactrim for paronychia.  Discussed return precautions.  Patient also endorses bilateral ear fullness and difficulty with hearing for years.  Upon assessment impacted cerumen noted to right ear.  Upon ear irrigation there were no underlying signs of infection.  Discussed following up with primary care if difficulty with hearing persist. Final Clinical Impressions(s) / UC Diagnoses   Final diagnoses:  Paronychia of third toe of right foot  Impacted cerumen of right ear     Discharge Instructions      Start taking Bactrim twice daily for 7 days.  You can apply warm compresses to the toe to help with swelling and pain.  Otherwise you can take Tylenol as needed for pain.  If your symptoms persist or worsen return here for reevaluation.  We have cleaned impacted earwax out of your right ear.  If you have persistent difficulty with hearing I would recommend following up with a primary care provider and they can provide a referral to have your hearing tested.    ED Prescriptions     Medication Sig Dispense Auth. Provider   sulfamethoxazole-trimethoprim (BACTRIM DS) 800-160 MG tablet Take 1 tablet by mouth 2 (two) times daily for 7 days. 14 tablet Wynonia Lawman A, NP      PDMP not reviewed this encounter.   Wynonia Lawman A, NP 11/01/23 2117

## 2023-11-13 ENCOUNTER — Other Ambulatory Visit: Payer: Self-pay

## 2023-11-13 ENCOUNTER — Encounter (HOSPITAL_COMMUNITY): Payer: Self-pay

## 2023-11-13 ENCOUNTER — Ambulatory Visit (HOSPITAL_COMMUNITY)
Admission: RE | Admit: 2023-11-13 | Discharge: 2023-11-13 | Disposition: A | Discharge status: Home / Self Care | Source: Ambulatory Visit | Attending: Family Medicine | Admitting: Family Medicine

## 2023-11-13 VITALS — BP 173/70 | HR 56 | Temp 98.3°F | Resp 18

## 2023-11-13 DIAGNOSIS — L03031 Cellulitis of right toe: Secondary | ICD-10-CM | POA: Diagnosis not present

## 2023-11-13 MED ORDER — LEVOFLOXACIN 250 MG PO TABS: 250.0000 mg | ORAL_TABLET | Freq: Every day | ORAL | 0 refills | Status: DC

## 2023-11-13 NOTE — ED Triage Notes (Addendum)
 Seen 2/25 for foot pain.  Provided antibiotics and continues to have pain. Reports toe is slightly better, but is still swelling and painful.  Friend states she thinks toe looks about the same as at prior visit.    Patient finished antibiotics.

## 2023-11-13 NOTE — ED Provider Notes (Signed)
 [facility]    CSN: [id number] Arrival date & time: 11/13/23  1543      History   Chief Complaint Chief Complaint  Patient presents with   Appointment    16:00   Toe Pain    HPI Casey Rice is a 63 y.o. female.    Toe Pain   Here for continued swelling and discomfort in her right middle toe. She was here February 25 and prescribed Bactrim for what appeared to be a paronychia and cellulitis of her right third toe.  It does feel better and she is able to complete the antibiotic course without problem.  It still hurts to touch the end and if she puts a shoe on.  It is still swollen some.   No fever No history of diabetes  She has trouble taking azithromycin and Augmentin, both due to diarrhea.    History reviewed. No pertinent past medical history.  There are no active problems to display for this patient.   History reviewed. No pertinent surgical history.  OB History   No obstetric history on file.      Home Medications    Prior to Admission medications   Medication Sig Start Date End Date Taking? Authorizing Provider  levofloxacin (LEVAQUIN) 250 MG tablet Take 1 tablet (250 mg total) by mouth daily. 11/13/23  Yes Zenia Resides, MD    Family History Family History  Problem Relation Age of Onset   Heart failure Mother    Heart failure Father    Diabetes Father    Glaucoma Father     Social History Social History   Tobacco Use   Smoking status: Every Day    Current packs/day: 0.15    Types: Cigarettes   Smokeless tobacco: Never  Vaping Use   Vaping status: Never Used  Substance Use Topics   Alcohol use: No   Drug use: No     Allergies   Augmentin [amoxicillin-pot clavulanate] and Z-pak [azithromycin]   Review of Systems Review of Systems   Physical Exam Triage Vital Signs ED Triage Vitals  Encounter Vitals Group     BP 11/13/23 1619 (!) 173/70     Systolic BP Percentile --      Diastolic BP Percentile --       Pulse Rate 11/13/23 1619 65     Resp 11/13/23 1619 18     Temp 11/13/23 1619 98.3 F (36.8 C)     Temp Source 11/13/23 1619 Oral     SpO2 11/13/23 1629 96 %     Weight --      Height --      Head Circumference --      Peak Flow --      Pain Score 11/13/23 1617 4     Pain Loc --      Pain Education --      Exclude from Growth Chart --    No data found.  Updated Vital Signs BP (!) 173/70 (BP Location: Left Arm)   Pulse (!) 56   Temp 98.3 F (36.8 C) (Oral)   Resp 18   SpO2 96%   Visual Acuity Right Eye Distance:   Left Eye Distance:   Bilateral Distance:    Right Eye Near:   Left Eye Near:    Bilateral Near:     Physical Exam Vitals reviewed.  Constitutional:      General: She is not in acute distress.    Appearance: She is not ill-appearing,  toxic-appearing or diaphoretic.  HENT:     Mouth/Throat:     Mouth: Mucous membranes are moist.  Musculoskeletal:     Comments: There is some mild erythema of the distal third toe on her right foot.  There is mild tenderness of the end of the toe.  No drainage and no focal abscess formation that I can see today.  Skin:    Coloration: Skin is not jaundiced or pale.  Neurological:     General: No focal deficit present.     Mental Status: She is alert and oriented to person, place, and time.  Psychiatric:        Behavior: Behavior normal.      UC Treatments / Results  Labs (all labs ordered are listed, but only abnormal results are displayed) Labs Reviewed - No data to display  EKG   Radiology No results found.  Procedures Procedures (including critical care time)  Medications Ordered in UC Medications - No data to display  Initial Impression / Assessment and Plan / UC Course  I have reviewed the triage vital signs and the nursing notes.  Pertinent labs & imaging results that were available during my care of the patient were reviewed by me and considered in my medical decision making (see chart for  details).     Levaquin is sent in for what might be continued cellulitis.  I have asked her to follow-up with podiatry.  She and her daughter are also given instructions on how to set up primary care on the [facility] health website. Final Clinical Impressions(s) / UC Diagnoses   Final diagnoses:  Cellulitis of toe of right foot     Discharge Instructions      Levaquin 250 mg--take 1 tablet daily for 7 days  Please call podiatry for an appointment to follow-up on this issue and see what other evaluation or treatment you need.  You can use the QR code/website at the back of the summary paperwork to schedule yourself a new patient appointment with primary care      ED Prescriptions     Medication Sig Dispense Auth. Provider   levofloxacin (LEVAQUIN) 250 MG tablet Take 1 tablet (250 mg total) by mouth daily. 7 tablet Britney Newstrom, Janace Aris, MD      PDMP not reviewed this encounter.   Zenia Resides, MD 11/13/23 [phone]

## 2023-11-13 NOTE — Discharge Instructions (Signed)
 Levaquin 250 mg--take 1 tablet daily for 7 days  Please call podiatry for an appointment to follow-up on this issue and see what other evaluation or treatment you need.  You can use the QR code/website at the back of the summary paperwork to schedule yourself a new patient appointment with primary care

## 2023-12-06 ENCOUNTER — Encounter: Payer: Self-pay | Admitting: Podiatry

## 2023-12-06 ENCOUNTER — Ambulatory Visit (INDEPENDENT_AMBULATORY_CARE_PROVIDER_SITE_OTHER)

## 2023-12-06 ENCOUNTER — Ambulatory Visit (INDEPENDENT_AMBULATORY_CARE_PROVIDER_SITE_OTHER): Admitting: Podiatry

## 2023-12-06 DIAGNOSIS — M7751 Other enthesopathy of right foot: Secondary | ICD-10-CM

## 2023-12-06 DIAGNOSIS — L03031 Cellulitis of right toe: Secondary | ICD-10-CM | POA: Diagnosis not present

## 2023-12-06 MED ORDER — LEVOFLOXACIN 250 MG PO TABS: 250.0000 mg | ORAL_TABLET | Freq: Every day | ORAL | 0 refills | Status: AC

## 2023-12-06 NOTE — Progress Notes (Signed)
  Subjective:  Patient ID: Casey Rice, female    DOB: 1961-01-16,   MRN: [id number]  No chief complaint on file.   63 y.o. female presents for concern of pain and swelling to right third digit. Relates this has been ongoing for a couple months. She was seen in urgent care intiially and given antibtioics. Relates the whole foot was red and did not make much of a difference at that time. She followed up again and was given levaquin and relates that did improve things. She has been off of the abx for two weeks and relates no worsening. She does still have swelling and pain in the toe especially in shoes. Patient relates she does not believe she has diabetes.  Denies any other pedal complaints. Denies n/v/f/c.   No past medical history on file.  Objective:  Physical Exam: Vascular: DP/PT pulses 2/4 bilateral. CFT <3 seconds. Normal hair growth on digits. No edema.  Skin. No lacerations or abrasions bilateral feet. Right third digit with mild erythema and edema noted. Relates it is improved. Mildly tender to distal digit. No open ulceration noted and no changes noted around nail bed.  Musculoskeletal: MMT 5/5 bilateral lower extremities in DF, PF, Inversion and Eversion. Deceased ROM in DF of ankle joint.  Neurological: Sensation intact to light touch.   Assessment:   1. Cellulitis of toe, right      Plan:  Patient was evaluated and treated and all questions answered. Cellulitis right toe  -Off-loading with sandal -Levaquin sent to pharmacy x 10 days.  -MRI ordered for further evaluation of possible osteomyelitis.  -Will order baseline labs as well to check for infection.  -Discussed glucose control and proper protein-rich diet.  -Discussed if any worsening redness, pain, fever or chills to call or may need to report to the emergency room. Patient expressed understanding.    Return in about 2 weeks (around 12/20/2023) for toe check, possible osteo.   Louann Sjogren, DPM

## 2023-12-20 ENCOUNTER — Encounter: Payer: Self-pay | Admitting: Podiatry

## 2023-12-20 ENCOUNTER — Ambulatory Visit (INDEPENDENT_AMBULATORY_CARE_PROVIDER_SITE_OTHER): Admitting: Podiatry

## 2023-12-20 DIAGNOSIS — L03031 Cellulitis of right toe: Secondary | ICD-10-CM

## 2023-12-20 NOTE — Progress Notes (Signed)
  Subjective:  Patient ID: Casey Rice, female    DOB: 1960-09-26,   MRN: [id number]  Chief Complaint  Patient presents with   Toe Pain    Pt presents for a follow up of pain and swelling to right third digit.states she is doing better.   Interval history: Patient here for follow-up of right third digit cellulitis. She has finished antibitoics and relates doing well no pain except for around the toenail. She did not get the MRI as she was concerned for dangers with getting an MRI.   63 y.o. female presents for concern of pain and swelling to right third digit. Relates this has been ongoing for a couple months. She was seen in urgent care intiially and given antibtioics. Relates the whole foot was red and did not make much of a difference at that time. She followed up again and was given levaquin and relates that did improve things. She has been off of the abx for two weeks and relates no worsening. She does still have swelling and pain in the toe especially in shoes. Patient relates she does not believe she has diabetes.  Denies any other pedal complaints. Denies n/v/f/c.   History reviewed. No pertinent past medical history.  Objective:  Physical Exam: Vascular: DP/PT pulses 2/4 bilateral. CFT <3 seconds. Normal hair growth on digits. No edema.  Skin. No lacerations or abrasions bilateral feet. Right third digit with mild erythema and edema noted. Relates it is improved. Mildly tender to distal digit. No open ulceration noted and no changes noted around nail bed.  Musculoskeletal: MMT 5/5 bilateral lower extremities in DF, PF, Inversion and Eversion. Deceased ROM in DF of ankle joint.  Neurological: Sensation intact to light touch.   Assessment:   1. Cellulitis of toe, right       Plan:  Patient was evaluated and treated and all questions answered. Cellulitis right toe  -Off-loading with sandal -Did discuss MRIs are safe and nothing to worry about and given my concern for  osteomyeltiis would advise she get this scheduled however patient would like to hold off.  -There has not been any worsening. Agreed with follow up in two more weeks off antbiotics and if stable we should be ok but if any worsening will needs to get the MRI.  -Discussed glucose control and proper protein-rich diet.  -Discussed if any worsening redness, pain, fever or chills to call or may need to report to the emergency room. Patient expressed understanding.   Return in 2 weeks for recheck of toe.    Return in about 2 weeks (around 01/03/2024) for toe check, cellulitis/osteo.   Jennefer Moats, DPM

## 2023-12-28 ENCOUNTER — Ambulatory Visit
Admission: RE | Admit: 2023-12-28 | Discharge: 2023-12-28 | Discharge status: Home / Self Care | Source: Ambulatory Visit | Attending: Podiatry

## 2023-12-28 DIAGNOSIS — L03031 Cellulitis of right toe: Secondary | ICD-10-CM

## 2024-01-04 ENCOUNTER — Encounter: Payer: Self-pay | Admitting: Podiatry

## 2024-01-04 ENCOUNTER — Ambulatory Visit (INDEPENDENT_AMBULATORY_CARE_PROVIDER_SITE_OTHER): Admitting: Podiatry

## 2024-01-04 DIAGNOSIS — M86672 Other chronic osteomyelitis, left ankle and foot: Secondary | ICD-10-CM

## 2024-01-04 MED ORDER — LEVOFLOXACIN 750 MG PO TABS: 750.0000 mg | ORAL_TABLET | Freq: Every day | ORAL | 0 refills | Status: AC

## 2024-01-04 NOTE — Progress Notes (Signed)
  Subjective:  Patient ID: Casey Rice, female    DOB: 1960-11-07,   MRN: [id number]  No chief complaint on file.  Interval history: Patient here for follow-up of right third digit cellulitis. She has done MRI and relates the toe is still doing well and no worsening. Has been off antibiotics.    63 y.o. female presents for concern of pain and swelling to right third digit. Relates this has been ongoing for a couple months. She was seen in urgent care intiially and given antibtioics. Relates the whole foot was red and did not make much of a difference at that time. She followed up again and was given levaquin  and relates that did improve things. She has been off of the abx for two weeks and relates no worsening. She does still have swelling and pain in the toe especially in shoes. Patient relates she does not believe she has diabetes.  Denies any other pedal complaints. Denies n/v/f/c.   No past medical history on file.  Objective:  Physical Exam: Vascular: DP/PT pulses 2/4 bilateral. CFT <3 seconds. Normal hair growth on digits. No edema.  Skin. No lacerations or abrasions bilateral feet. Right third digit with mild erythema and edema noted. Relates it is improved. Mildly tender to distal digit. No open ulceration noted and no changes noted around nail bed.  Musculoskeletal: MMT 5/5 bilateral lower extremities in DF, PF, Inversion and Eversion. Deceased ROM in DF of ankle joint.  Neurological: Sensation intact to light touch.   Awaiting MRI read.   Do suspect osteomyelitis given edema on T1 and swelling on T2 in distal phalanx on third toe and some into middle phalanx.   Assessment:   1. 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 Cellulitis right toe  -Off-loading with sandal -MRI reviewed with patient and discussed concern for possible infection pending read. Did discusse she however remains stable  and hopefully infection likely more chornic and not acute at this time.  -Would like to do 2 more weeks of levaquin  to complete 6 week course but given stability now do not feel it is necessary to consider amputation. Did discuss she is at risk if acute infection reoccurs.  -Discussed glucose control and proper protein-rich diet.  -Discussed if any worsening redness, pain, fever or chills to call or may need to report to the emergency room. Patient expressed understanding.   Return in 3 months  for recheck of toe.    No follow-ups on file.   Jennefer Moats, DPM

## 2024-01-18 ENCOUNTER — Other Ambulatory Visit: Payer: Self-pay | Admitting: Podiatry

## 2024-04-04 ENCOUNTER — Ambulatory Visit: Admitting: Podiatry
